# Patient Record
Sex: Male | Born: 1995 | Race: White | Hispanic: No | Marital: Single | Smoking: Former smoker
Health system: Southern US, Community
[De-identification: ages and names within clinical notes are randomized; demographics above are authoritative.]

## PROBLEM LIST (undated history)

## (undated) DIAGNOSIS — Q676 Pectus excavatum: Secondary | ICD-10-CM

## (undated) DIAGNOSIS — R0602 Shortness of breath: Secondary | ICD-10-CM

## (undated) DIAGNOSIS — R0789 Other chest pain: Secondary | ICD-10-CM

## (undated) HISTORY — DX: Pectus excavatum: Q67.6

## (undated) HISTORY — PX: TONSILLECTOMY: SUR1361

## (undated) HISTORY — PX: EYE SURGERY: SHX253

## (undated) HISTORY — DX: Shortness of breath: R06.02

## (undated) HISTORY — DX: Other chest pain: R07.89

---
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
## 2017-09-07 ENCOUNTER — Telehealth: Payer: Self-pay | Admitting: *Deleted

## 2017-09-10 ENCOUNTER — Encounter: Payer: Federal, State, Local not specified - PPO | Admitting: Cardiothoracic Surgery

## 2017-10-24 ENCOUNTER — Encounter: Payer: Self-pay | Admitting: *Deleted

## 2017-10-24 DIAGNOSIS — R0602 Shortness of breath: Secondary | ICD-10-CM

## 2017-10-24 DIAGNOSIS — R0789 Other chest pain: Secondary | ICD-10-CM | POA: Insufficient documentation

## 2017-10-24 DIAGNOSIS — Q676 Pectus excavatum: Secondary | ICD-10-CM | POA: Insufficient documentation

## 2017-11-01 ENCOUNTER — Institutional Professional Consult (permissible substitution) (INDEPENDENT_AMBULATORY_CARE_PROVIDER_SITE_OTHER): Payer: BLUE CROSS/BLUE SHIELD | Admitting: Cardiothoracic Surgery

## 2017-11-01 ENCOUNTER — Encounter: Payer: Self-pay | Admitting: Cardiothoracic Surgery

## 2017-11-01 ENCOUNTER — Other Ambulatory Visit: Payer: Self-pay

## 2017-11-01 VITALS — BP 136/78 | HR 63 | Resp 16 | Ht 72.0 in | Wt 199.0 lb

## 2017-11-01 DIAGNOSIS — Q676 Pectus excavatum: Secondary | ICD-10-CM

## 2017-11-01 NOTE — Progress Notes (Signed)
[address]       [place] [address]             [phone]                    Robey Massmann [facility] Medical Record #[id number] Date of Birth: 13-Feb-1996  Referring: Jonah Blue, MD Primary Care: Caroline More, PA-C Primary Cardiologist: No primary care provider on file.  Chief Complaint:    Chief Complaint  Patient presents with  . 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 male is seen in the office  today for of pectus deformity.  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  She was told that his sternum and ribs have been broken.  He was evaluated in [facility] several years ago for his pectus deformity, and prior to start playing football.  He played high school football without difficulty.  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    Current Activity/ Functional Status:  Patient is independent with mobility/ambulation, transfers, ADL's, IADL's.   Zubrod Score: At the time of surgery this patient's most appropriate activity status/level should be described as: [x]     0    Normal activity, no symptoms []     1    Restricted in physical strenuous activity but ambulatory, able to do out light work []     2    Ambulatory and capable of self care, unable to do work activities, up and about               >50 % of waking hours                              []     3    Only limited self care, in bed greater than 50% of waking hours []     4    Completely disabled, no self care, confined to bed or chair []     5    Moribund   Past Medical History:  Diagnosis Date    Past Surgical History:  Procedure Laterality Date  . EYE SURGERY     x 7  . TONSILLECTOMY      Family History  Problem Relation Age of Onset  . Hypertension Mother   . Hypertension Maternal Grandmother   . Diabetes Maternal  Grandmother   . Arthritis Maternal Grandmother     Social History   Socioeconomic History  . Marital status: Single    Social History   Tobacco Use  Smoking Status Former Smoker  . Types: Cigarettes  Smokeless Tobacco Never Used  Tobacco Comment   EXTREMELY RARE    Social History   Substance and Sexual Activity  Alcohol Use Yes   Comment: occasional     Allergies  Allergen Reactions  . Other Other (See Comments)    Plastic surgical tape burns skin - paper tape is ok    No current outpatient medications on file.   No current facility-administered medications for this visit.     Pertinent items are noted in HPI.   Review of Systems:     Review of Systems  Constitutional: Negative.   HENT: Negative.   Eyes: Negative.   Respiratory: Negative.   Cardiovascular: Negative.  Gastrointestinal: Negative.   Genitourinary: Negative.   Musculoskeletal: Negative.   Skin: Negative.   Neurological: Negative.   Endo/Heme/Allergies: Negative.   Psychiatric/Behavioral: Negative.      PHYSICAL EXAMINATION: BP 136/78 (BP Location: Right Arm, Patient Position: Sitting, Cuff Size: Large)   Pulse 63   Resp 16   Ht 6' (1.829 m)   Wt 199 lb (90.3 kg)   SpO2 99% Comment: ON RA  BMI 26.99 kg/m  General appearance: alert and no distress Head: Normocephalic, without obvious abnormality, atraumatic Neck: no adenopathy, no carotid bruit, no JVD, supple, symmetrical, trachea midline and thyroid not enlarged, symmetric, no tenderness/mass/nodules Lymph nodes: Cervical, supraclavicular, and axillary nodes normal. Resp: clear to auscultation bilaterally Back: symmetric, no curvature. ROM normal. No CVA tenderness. Cardio: regular rate and rhythm, S1, S2 normal, no murmur, click, rub or gallop GI: soft, non-tender; bowel sounds normal; no masses,  no organomegaly Extremities: extremities normal, atraumatic, no cyanosis or edema and Homans sign is negative, no sign of  DVT Neurologic: Grossly normal Chest : Minor pectus deformity with prominent costal margins, see photograph     Diagnostic Studies & Laboratory data:     Recent Radiology Findings:   No results found. Chest x-ray from 2019 is reviewed, lateral view does not show much posterior displacement sternum. Full CT scan for Haller index is not been done  I have independently reviewed the above radiology studies  and reviewed the findings with the patient.   Recent Lab Findings: No results found for: WBC, HGB, HCT, PLT, GLUCOSE, CHOL, TRIG, HDL, LDLDIRECT, LDLCALC, ALT, AST, NA, K, CL, CREATININE, BUN, CO2, TSH, INR, GLUF, HGBA1C    Assessment / Plan:   Minor pectus deformity, not of severity to warrant further evaluation or consideration for surgery.  This was discussed with the patient and his mother in detail     I  spent 30 minutes with  the patient face to face and greater then 50% of the time was spent in counseling and coordination of care.    Delight OvensEdward B Zamyia Gowell MD      [address] [place] [id number] Office [phone]   Beeper [phone]  11/01/2017 5:01 PM

## 2017-11-05 LAB — PULMONARY FUNCTION TEST

## 2022-03-29 ENCOUNTER — Ambulatory Visit: Payer: 59 | Admitting: Family Medicine

## 2022-03-29 ENCOUNTER — Encounter: Payer: Self-pay | Admitting: Family Medicine

## 2022-03-29 VITALS — BP 136/102 | HR 80 | Temp 97.6°F | Ht 71.0 in | Wt 220.0 lb

## 2022-03-29 DIAGNOSIS — Z20822 Contact with and (suspected) exposure to covid-19: Secondary | ICD-10-CM

## 2022-03-29 DIAGNOSIS — J069 Acute upper respiratory infection, unspecified: Secondary | ICD-10-CM

## 2022-03-29 MED ORDER — NAPROXEN 500 MG PO TABS: 500.0000 mg | ORAL_TABLET | Freq: Two times a day (BID) | ORAL | 0 refills | Status: AC | PRN

## 2022-03-29 MED ORDER — BENZONATATE 100 MG PO CAPS: 100.0000 mg | ORAL_CAPSULE | Freq: Two times a day (BID) | ORAL | 0 refills | Status: AC | PRN

## 2022-03-29 NOTE — Progress Notes (Unsigned)
   Acute Office Visit  Subjective:     Patient ID: Jeffrey Baldwin, male    DOB: [birth year], 26 y.o.   MRN: [id number]  Chief Complaint  Patient presents with   Cough    Sore throat, lethargic, sneezing, ear pain X1 day    HPI Patient is in today for ***  ROS      Objective:    BP (!) 136/102   Pulse 80   Temp 97.6 F (36.4 C)   Ht 5\' 11"  (1.803 m)   Wt 220 lb (99.8 kg)   SpO2 99%   BMI 30.68 kg/m  {Vitals History (Optional):23777}  Physical Exam  No results found for any visits on 03/29/22.      Assessment & Plan:   Problem List Items Addressed This Visit   None Visit Diagnoses     Viral URI with cough    -  Primary       No orders of the defined types were placed in this encounter.   No follow-ups on file.  Elwin Mocha, MD
